# Patient Record
Sex: Female | Born: 1960 | Race: White | Hispanic: No | Marital: Married | Smoking: Never smoker
Health system: Southern US, Community
[De-identification: ages and names within clinical notes are randomized; demographics above are authoritative.]

## PROBLEM LIST (undated history)

## (undated) DIAGNOSIS — M810 Age-related osteoporosis without current pathological fracture: Secondary | ICD-10-CM

## (undated) DIAGNOSIS — I10 Essential (primary) hypertension: Secondary | ICD-10-CM

## (undated) DIAGNOSIS — E785 Hyperlipidemia, unspecified: Secondary | ICD-10-CM

## (undated) HISTORY — PX: ENDOMETRIAL ABLATION: SHX621

## (undated) HISTORY — PX: LIVER RESECTION: SHX1977

---

## 2001-10-30 ENCOUNTER — Encounter: Admission: RE | Admit: 2001-10-30 | Discharge: 2001-10-30 | Payer: Self-pay | Admitting: Emergency Medicine

## 2001-10-30 ENCOUNTER — Encounter: Payer: Self-pay | Admitting: Emergency Medicine

## 2002-01-01 ENCOUNTER — Encounter: Admission: RE | Admit: 2002-01-01 | Discharge: 2002-01-01 | Payer: Self-pay | Admitting: Emergency Medicine

## 2002-01-01 ENCOUNTER — Encounter: Payer: Self-pay | Admitting: Emergency Medicine

## 2002-03-05 ENCOUNTER — Encounter: Payer: Self-pay | Admitting: Emergency Medicine

## 2002-03-05 ENCOUNTER — Encounter: Admission: RE | Admit: 2002-03-05 | Discharge: 2002-03-05 | Payer: Self-pay | Admitting: Emergency Medicine

## 2002-07-15 ENCOUNTER — Other Ambulatory Visit: Admission: RE | Admit: 2002-07-15 | Discharge: 2002-07-15 | Payer: Self-pay | Admitting: Obstetrics & Gynecology

## 2002-09-16 ENCOUNTER — Encounter: Admission: RE | Admit: 2002-09-16 | Discharge: 2002-09-16 | Payer: Self-pay | Admitting: Emergency Medicine

## 2002-09-16 ENCOUNTER — Encounter: Payer: Self-pay | Admitting: Emergency Medicine

## 2003-09-27 ENCOUNTER — Other Ambulatory Visit: Admission: RE | Admit: 2003-09-27 | Discharge: 2003-09-27 | Payer: Self-pay | Admitting: Obstetrics & Gynecology

## 2004-01-10 ENCOUNTER — Encounter: Admission: RE | Admit: 2004-01-10 | Discharge: 2004-01-10 | Payer: Self-pay | Admitting: Emergency Medicine

## 2004-02-01 ENCOUNTER — Encounter: Admission: RE | Admit: 2004-02-01 | Discharge: 2004-02-01 | Payer: Self-pay | Admitting: Emergency Medicine

## 2004-06-28 ENCOUNTER — Encounter: Admission: RE | Admit: 2004-06-28 | Discharge: 2004-06-28 | Payer: Self-pay | Admitting: Emergency Medicine

## 2005-01-10 ENCOUNTER — Other Ambulatory Visit: Admission: RE | Admit: 2005-01-10 | Discharge: 2005-01-10 | Payer: Self-pay | Admitting: Obstetrics & Gynecology

## 2005-03-19 ENCOUNTER — Ambulatory Visit: Payer: Self-pay | Admitting: Internal Medicine

## 2005-04-13 ENCOUNTER — Ambulatory Visit: Payer: Self-pay | Admitting: Internal Medicine

## 2005-04-27 ENCOUNTER — Encounter (INDEPENDENT_AMBULATORY_CARE_PROVIDER_SITE_OTHER): Payer: Self-pay | Admitting: Specialist

## 2005-04-27 ENCOUNTER — Ambulatory Visit: Payer: Self-pay | Admitting: Internal Medicine

## 2005-06-07 ENCOUNTER — Ambulatory Visit: Payer: Self-pay | Admitting: Internal Medicine

## 2005-08-26 ENCOUNTER — Encounter: Admission: RE | Admit: 2005-08-26 | Discharge: 2005-08-26 | Payer: Self-pay | Admitting: Emergency Medicine

## 2005-08-28 ENCOUNTER — Encounter: Admission: RE | Admit: 2005-08-28 | Discharge: 2005-08-28 | Payer: Self-pay | Admitting: Emergency Medicine

## 2006-02-04 ENCOUNTER — Encounter: Admission: RE | Admit: 2006-02-04 | Discharge: 2006-02-04 | Payer: Self-pay | Admitting: Emergency Medicine

## 2006-07-29 ENCOUNTER — Encounter (INDEPENDENT_AMBULATORY_CARE_PROVIDER_SITE_OTHER): Payer: Self-pay | Admitting: Specialist

## 2006-07-29 ENCOUNTER — Ambulatory Visit (HOSPITAL_COMMUNITY): Admission: RE | Admit: 2006-07-29 | Discharge: 2006-07-29 | Payer: Self-pay | Admitting: Obstetrics & Gynecology

## 2006-10-09 ENCOUNTER — Encounter: Admission: RE | Admit: 2006-10-09 | Discharge: 2006-10-09 | Payer: Self-pay | Admitting: Emergency Medicine

## 2009-03-26 ENCOUNTER — Ambulatory Visit: Payer: Self-pay | Admitting: Internal Medicine

## 2009-05-16 ENCOUNTER — Ambulatory Visit: Payer: Self-pay | Admitting: Internal Medicine

## 2009-12-27 ENCOUNTER — Encounter: Admission: RE | Admit: 2009-12-27 | Discharge: 2009-12-27 | Payer: Self-pay | Admitting: Obstetrics & Gynecology

## 2010-01-21 ENCOUNTER — Ambulatory Visit: Payer: Self-pay | Admitting: Family Medicine

## 2010-09-16 ENCOUNTER — Ambulatory Visit: Payer: Self-pay | Admitting: Internal Medicine

## 2010-10-07 ENCOUNTER — Encounter: Payer: Self-pay | Admitting: Emergency Medicine

## 2011-02-02 NOTE — Op Note (Signed)
NAME:  Alicia Love, Alicia Love NO.:  [id number]   MEDICAL RECORD NO.:  [id number]          PATIENT TYPE:  AMB   LOCATION:  SDC                           FACILITY:  [facility]   PHYSICIAN:  Freddy Finner, M.D.   DATE OF BIRTH:  03/02/1961   DATE OF PROCEDURE:  07/29/2006  DATE OF DISCHARGE:                                 OPERATIVE REPORT   PREOPERATIVE DIAGNOSES:  1. Chronic pelvic pain.  2. Known previous pelvic endometriosis.  3. Menorrhagia.  4. Severe dysmenorrhea.   POSTOPERATIVE DIAGNOSES:  1. Minimal recurrent or persistent endometriosis.  2. Uterine enlargement.  3. Endometrial polyps.   PROCEDURE:  Laparoscopy with fulguration of all visible evidence of active  endometriosis plus uterosacral nerve ablation followed by hysteroscopy D&C  with resection of endometrial polyps followed by NovaSure endometrial  ablation.   COMPLICATIONS:  None.   ESTIMATED BLOOD LOSS:  20 mL.   ANESTHESIA:  General.   The patient is a 50 year old with known previous pelvic endometriosis who  has a long history of recurrent symptoms with pelvic pain, dysmenorrhea and  menorrhagia. She is now admitted for surgery. She was admitted on the  morning of surgery, she was given a bolus of Ancef IV preoperatively. She  was brought to the operating room, there placed under adequate general  anesthesia, placed in dorsal lithotomy position using the [place] stirrup  system. Betadine prep of abdomen, perineum and vagina was carried out in the  usual fashion, bladder was evacuated using sterile technique. The cervix was  visualized with a bivalve speculum and a Hulka tenaculum attached without  difficulty. Sterile drapes were applied. The laparoscopic procedure was  performed first. Two small incisions were made, one at the umbilicus and one  just above the symphysis. Through the upper incision, an 11 mm non bladed  disposable trocar was introduced while elevating the anterior abdominal wall  manually. Direct inspection revealed no evidence of injury on entry,  pneumoperitoneum was allowed to accumulate using carbon dioxide gas. A  second 5 mm trocar was used through a small incision just above the  symphysis and it was placed under direct visualization. A blunt probe was  used through the trocar sleeve for traction and exposure during the  procedure. Systematic examination of the pelvic and abdominal contents was  carried out and photographs were made. The uterus was enlarged in overall  size and body to palpation with a probe. There were no definite nodules in  the uterus that were visible. The tube and ovaries on each side were normal.  There were some scattered lesions along the inferior margin of the  peritoneum overlying the cervix and along the right uterosacral ligament and  a small area on the left uterosacral ligament. All of these were fulgurated.  The uterosacral ligaments were fulgurated at their junction with the cervix.  There was no intraabdominal bleeding. This portion of the procedure was  terminated, gas was allowed to escape from the abdomen. The skin incisions  were closed with interrupted subcuticular sutures of 3-0 Dexon, 0.25%  Marcaine was injected into the incision site for postoperative analgesia.  Steri-Strips were  also applied to the lower incision. Attention was then  turned vaginally, the Hulka tenaculum was removed, cervix was grasped on the  anterior lip with a single tooth tenaculum. The cervix sounded to 3 cm,  uterus sounded to 10 cm, cavity width used was 6.5 cm for the NovaSure  procedure. The cervix was progressively dilated to 23 with Pratt's.  Inspection of the cavity revealed 2 small polyps. Gentle thorough curettage  and exploration with Randall stone forceps was carried out, reinspection  confirmed resection of the polyps. The NovaSure device was then applied  without difficulty. A total of 143 watts of power was used for 50 seconds  to  ablate the endometrium, the cavity width was 4 cm, reinspection revealed  adequate complete endometrial ablation. At this point, the procedure was  terminated, the instruments removed. A small puncture wound on the cervix  was injected with 1% Xylocaine for hemostasis. This produced adequate  hemostasis. The patient was given 30 mg of Toradol IV, 30 mg IM before  awakening her and taking her to the recovery room. She was taken there in  good condition. She will be given routine outpatient surgical instructions.  She is followup in the office in 2 weeks. She has Vicodin to be taken as  needed for postoperative pain.      Freddy Finner, M.D.  Electronically Signed     WRN/MEDQ  D:  07/29/2006  T:  07/29/2006  Job:  [id number]

## 2011-02-04 ENCOUNTER — Ambulatory Visit: Payer: Self-pay | Admitting: Internal Medicine

## 2011-09-21 ENCOUNTER — Ambulatory Visit: Payer: Self-pay

## 2011-11-13 ENCOUNTER — Ambulatory Visit: Payer: Self-pay | Admitting: Internal Medicine

## 2012-02-04 ENCOUNTER — Encounter: Payer: Self-pay | Admitting: Internal Medicine

## 2012-12-25 ENCOUNTER — Encounter: Payer: Self-pay | Admitting: Internal Medicine

## 2013-01-15 ENCOUNTER — Ambulatory Visit: Payer: Self-pay | Admitting: Internal Medicine

## 2017-03-09 ENCOUNTER — Ambulatory Visit
Admission: EM | Admit: 2017-03-09 | Discharge: 2017-03-09 | Disposition: A | Discharge status: Home / Self Care | Payer: BLUE CROSS/BLUE SHIELD

## 2017-03-09 ENCOUNTER — Ambulatory Visit (INDEPENDENT_AMBULATORY_CARE_PROVIDER_SITE_OTHER): Payer: BLUE CROSS/BLUE SHIELD

## 2017-03-09 ENCOUNTER — Encounter: Payer: Self-pay | Admitting: *Deleted

## 2017-03-09 DIAGNOSIS — S9032XA Contusion of left foot, initial encounter: Secondary | ICD-10-CM | POA: Diagnosis not present

## 2017-03-09 DIAGNOSIS — S92502A Displaced unspecified fracture of left lesser toe(s), initial encounter for closed fracture: Secondary | ICD-10-CM

## 2017-03-09 HISTORY — DX: Hyperlipidemia, unspecified: E78.5

## 2017-03-09 HISTORY — DX: Essential (primary) hypertension: I10

## 2017-03-09 HISTORY — DX: Age-related osteoporosis without current pathological fracture: M81.0

## 2017-03-09 MED ORDER — TRAMADOL HCL 50 MG PO TABS: 50.0000 mg | ORAL_TABLET | Freq: Three times a day (TID) | ORAL | 0 refills | Status: AC | PRN

## 2017-03-09 NOTE — ED Provider Notes (Signed)
[facility] ____________________________________________  Time seen: Approximately 9:57 AM  I have reviewed the triage vital signs and the nursing notes.   HISTORY  Chief Complaint Foot Pain  HPI Alicia Love is a 56 y.o. female presents for evaluation of left foot pain from an injury that occurred last night at home. Patient reports that approximately 10 PM last night she was at home, trying to step over a dog gate. Reports that she accidentally hit her left second toe on the dog ate causing pain. Reports that she has had pain, and a moderate level that is a throbbing sensation since. States she has had swelling and bruising onset. Reports did take Tylenol last night with slight improvement, no resolution. Reports also has applied ice. No other medications or alleviating factors taken. Denies previous injury to the same area. Reports did have surgery to her left great toe from recurrent bunions. Denies fall to the ground, head injury, loss consciousness or other injuries. Denies pain radiation or paresthesias. Reports otherwise feels well and denies other injury. Denies recent sickness. Denies recent antibiotic use.   PCP: In [place]   Past Medical History:  Diagnosis Date  . Hyperlipemia   . Hypertension   . Osteoporosis     There are no active problems to display for this patient.   Past Surgical History:  Procedure Laterality Date  . ENDOMETRIAL ABLATION    . LIVER RESECTION       No current facility-administered medications for this encounter.   Current Outpatient Prescriptions:  .  escitalopram (LEXAPRO) 20 MG tablet, Take 20 mg by mouth daily., Disp: , Rfl:  .  Est Estrogens-Methyltest (METHYLTEST-EST ESTROGENS PO), Take by mouth., Disp: , Rfl:  .  Progesterone Micronized (PROGESTERONE PO), Take 1 tablet by mouth daily., Disp: , Rfl:  .  traMADol (ULTRAM) 50 MG tablet, Take 1 tablet (50 mg total) by mouth every 8 (eight) hours as needed (Do  not drive or operate machinery while taking as can cause drowsiness.)., Disp: 12 tablet, Rfl: 0  Allergies Patient has no known allergies.  Family History  Problem Relation Age of Onset  . Hypertension Mother   . Hyperlipidemia Mother     Social History Social History  Substance Use Topics  . Smoking status: Never Smoker  . Smokeless tobacco: Never Used  . Alcohol use No    Review of Systems Constitutional: No fever/chills Cardiovascular: Denies chest pain. Respiratory: Denies shortness of breath. Gastrointestinal: No abdominal pain.   Musculoskeletal: Negative for back pain. As above   ____________________________________________   PHYSICAL EXAM:  VITAL SIGNS: ED Triage Vitals  Enc Vitals Group     BP 03/09/17 0932 138/85     Pulse Rate 03/09/17 0932 70     Resp 03/09/17 0932 16     Temp 03/09/17 0932 98.6 F (37 C)     Temp Source 03/09/17 0932 Oral     SpO2 03/09/17 0932 99 %     Weight 03/09/17 0933 182 lb (82.6 kg)     Height 03/09/17 0933 5\' 2"  (1.575 m)     Head Circumference --      Peak Flow --      Pain Score 03/09/17 0933 2     Pain Loc --      Pain Edu? --      Excl. in GC? --     Constitutional: Alert and oriented. Well appearing and in no acute distress. Cardiovascular: Normal rate, regular rhythm. Grossly normal heart sounds.  Good peripheral circulation. Respiratory: Normal respiratory effort without tachypnea nor retractions. Breath sounds are clear and equal bilaterally. No wheezes, rales, rhonchi. Musculoskeletal: No midline cervical, thoracic or lumbar tenderness to palpation. Bilateral pedal pulses equal and easily palpated. Except: Left foot proximal phalanx of second toe and second MTP joint moderate tenderness to palpation, mild swelling and mild to moderate ecchymosis, pain with flexion and extension, normal distal sensation and capillary refill. Left foot and left lower extremity otherwise nontender. Neurologic:  Normal speech and  language.Speech is normal. No gait instability.  Skin:  Skin is warm, dry. Psychiatric: Mood and affect are normal. Speech and behavior are normal. Patient exhibits appropriate insight and judgment   ___________________________________________   LABS (all labs ordered are listed, but only abnormal results are displayed)  Labs Reviewed - No data to display ____________________________________________  RADIOLOGY  Dg Toe 2nd Left  Result Date: 03/09/2017 CLINICAL DATA:  PT with left 2nd, toe pain after fall yesterday. No hx of previous injury or trauma. EXAM: LEFT SECOND TOE COMPARISON:  None. FINDINGS: Subtle cortical regularity along the articular surface of the proximal metaphysis, proximal phalanx, second digit. Small loose bodies in the joint at this level appear chronic. No dislocation. IMPRESSION: Questionable nondisplaced fracture the base of the proximal phalanx second digit. Recommend clinical correlation for point tenderness at the second metatarsal phalangeal joint. Electronically Signed   By: Genevive BiStewart  Edmunds M.D.   On: 03/09/2017 10:27   ____________________________________________   PROCEDURES Procedures    INITIAL IMPRESSION / ASSESSMENT AND PLAN / ED COURSE  Pertinent labs & imaging results that were available during my care of the patient were reviewed by me and considered in my medical decision making (see chart for details).  Well-appearing patient. No acute distress. Presents for left second toe pain after mechanical injury last night. Will evaluate by x-ray.  X-ray reviewed by myself as well as radiologist. Suspect nondisplaced fracture of the base of the proximal phalanx of second digit. Will buddy tape second and third toes, postoperative shoe applied. Encouraged rest, ice, elevation. Over-the-counter Tylenol or improvement as needed and tramadol as needed for breakthrough pain. Follow-up with podiatry or primary care physician in 1-2 weeks.Discussed indication,  risks and benefits of medications with patient.   [place] controlled substance database reviewed, and no recent controlled medications documented.  Discussed follow up with Primary care physician this week. Discussed follow up and return parameters including no resolution or any worsening concerns. Patient verbalized understanding and agreed to plan.   ____________________________________________   FINAL CLINICAL IMPRESSION(S) / ED DIAGNOSES  Final diagnoses:  Closed fracture of phalanx of left second toe, initial encounter  Contusion of left foot, initial encounter     New Prescriptions   TRAMADOL (ULTRAM) 50 MG TABLET    Take 1 tablet (50 mg total) by mouth every 8 (eight) hours as needed (Do not drive or operate machinery while taking as can cause drowsiness.).    Note: This dictation was prepared with Dragon dictation along with smaller phrase technology. Any transcriptional errors that result from this process are unintentional.         Renford DillsMiller, Dung Salinger, NP 03/09/17 1045

## 2017-03-09 NOTE — Discharge Instructions (Signed)
Take medication as prescribed. Rest. Elevate and apply ice. Drink plenty of fluids.   Follow up with your primary care physician or the above in one week. Return to Urgent care for new or worsening concerns.

## 2017-11-28 IMAGING — CR DG TOE 2ND 2+V*L*
3 series · 3 of 3 positions shown · non-contrast
Comparison: None.

CLINICAL DATA: PT with left 2nd, toe pain after fall yesterday. No
hx of previous injury or trauma.

EXAM:
LEFT SECOND TOE

[toe ap]
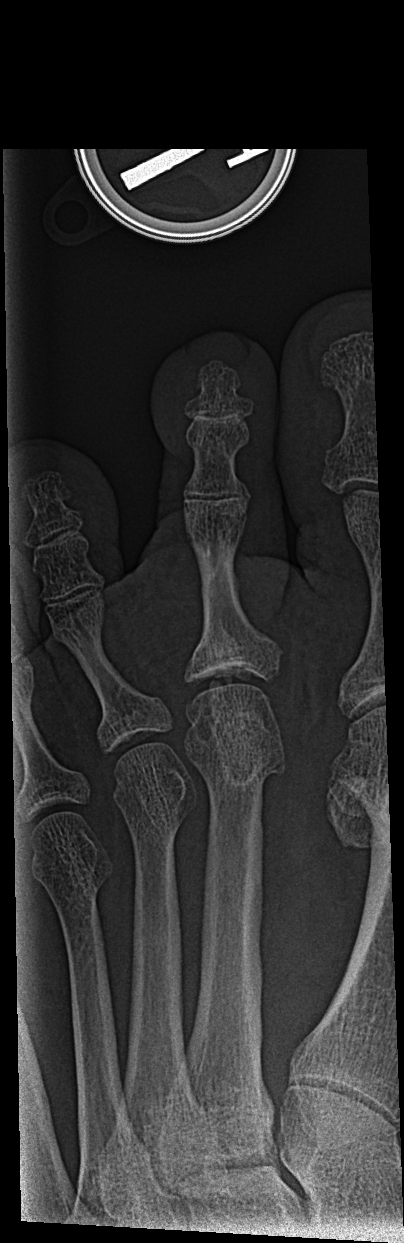

[toe obl]
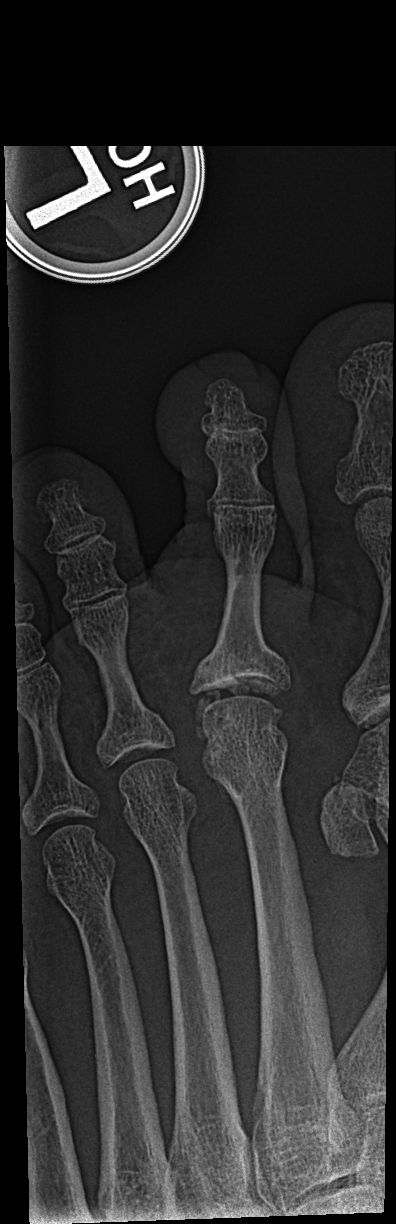

[toe lat]
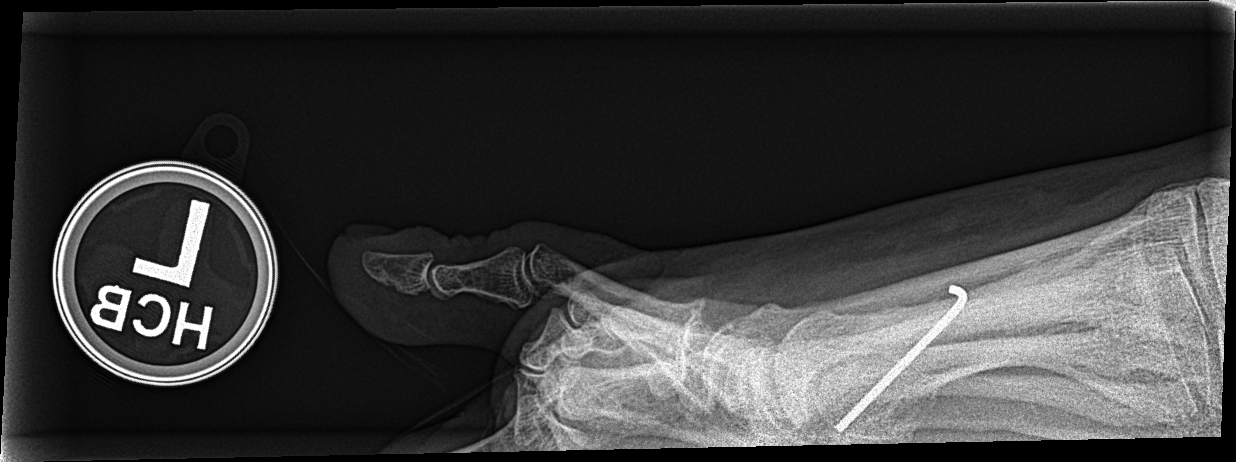

[3 of 3 positions shown; findings below may reference images not displayed]

FINDINGS: Subtle cortical regularity along the articular surface of the
proximal metaphysis, proximal phalanx, second digit. Small loose
bodies in the joint at this level appear chronic. No dislocation.
IMPRESSION: Questionable nondisplaced fracture the base of the proximal phalanx
second digit. Recommend clinical correlation for point tenderness at
the second metatarsal phalangeal joint.

## 2020-09-13 ENCOUNTER — Ambulatory Visit: Payer: Self-pay

## 2021-04-20 NOTE — Progress Notes (Signed)
[facility] - Clinic Note  04/24/2021     CHIEF COMPLAINT Patient presents for Retina Evaluation   HISTORY OF PRESENT ILLNESS: Alicia Love is a 60 y.o. female who presents to the clinic today for:   HPI     Retina Evaluation   In left eye.  Associated Symptoms Flashes and Floaters.  Negative for Distortion, Blind Spot, Pain, Redness, Photophobia, Glare, Trauma, Scalp Tenderness, Jaw Claudication, Shoulder/Hip pain, Fever, Weight Loss and Fatigue.  Context:  distance vision.  Treatments tried include no treatments.  I, the attending physician,  performed the HPI with the patient and updated documentation appropriately.        Comments   Pt is here on the referral of Dr. Lucina Mellow for concern of PVD OS, pt states she went to see Dr. Lucina Mellow bc she started to see dark fol temporally extending nasally, as well as a bunch of floaters, she states the floaters have now accumulated in her central vision, pt states Dr. Lucina Mellow told her the vitreous jelly in her eyes was "thickening" due to age, pt denies being diabetic, but is hypertensive      Last edited by Rennis Chris, MD on 04/24/2021  2:14 PM.    Pt is here on the referral of Dr. Lucina Mellow for concern of PVD OS, pt states she has been seeing Dr. Lucina Mellow for 36 years, she states a couple weeks ago she started to see dark fol and floaters in her left eye only, pt states these floaters now look like a "screen" in her vision, she states she sees light fol in the dark as well, pt denies being diabetic, but does take medication for HTN  Referring physician: Earnstine Regal, OD [address] [place],  [place] [address]  HISTORICAL INFORMATION:   Selected notes from the MEDICAL RECORD NUMBER Referred by Dr. Lucina Mellow for PVD OD LEE:  Ocular Hx- PMH-    CURRENT MEDICATIONS: No current outpatient medications on file. (Ophthalmic Drugs)   No current facility-administered medications for this visit. (Ophthalmic Drugs)   Current  Outpatient Medications (Other)  Medication Sig   Cholecalciferol 50 MCG (2000 UT) TABS Take 1 tablet by mouth daily.   estradiol (ESTRACE) 2 MG tablet Take by mouth.   hydrochlorothiazide (HYDRODIURIL) 12.5 MG tablet Take by mouth.   progesterone (PROMETRIUM) 100 MG capsule Take by mouth.   amphetamine-dextroamphetamine (ADDERALL XR) 30 MG 24 hr capsule Take 30 mg by mouth daily.   escitalopram (LEXAPRO) 20 MG tablet Take 20 mg by mouth daily.   Est Estrogens-Methyltest (METHYLTEST-EST ESTROGENS PO) Take by mouth.   Progesterone Micronized (PROGESTERONE PO) Take 1 tablet by mouth daily.   rosuvastatin (CRESTOR) 10 MG tablet Take 10 mg by mouth daily.   traMADol (ULTRAM) 50 MG tablet Take 1 tablet (50 mg total) by mouth every 8 (eight) hours as needed (Do not drive or operate machinery while taking as can cause drowsiness.).   No current facility-administered medications for this visit. (Other)    REVIEW OF SYSTEMS: ROS   Positive for: Cardiovascular, Eyes Negative for: Constitutional, Gastrointestinal, Neurological, Skin, Genitourinary, Musculoskeletal, HENT, Endocrine, Respiratory, Psychiatric, Allergic/Imm, Heme/Lymph Last edited by Posey Boyer, COT on 04/24/2021  1:26 PM.       ALLERGIES No Known Allergies  PAST MEDICAL HISTORY Past Medical History:  Diagnosis Date   Hyperlipemia    Hypertension    Osteoporosis    Past Surgical History:  Procedure Laterality Date   ENDOMETRIAL ABLATION  LIVER RESECTION      FAMILY HISTORY Family History  Problem Relation Age of Onset   Macular degeneration Mother    Hypertension Mother    Hyperlipidemia Mother    Macular degeneration Maternal Aunt     SOCIAL HISTORY Social History   Tobacco Use   Smoking status: Never   Smokeless tobacco: Never  Substance Use Topics   Alcohol use: No   Drug use: No         OPHTHALMIC EXAM:  Base Eye Exam     Visual Acuity (Snellen - Linear)       Right Left   Dist cc  20/20 -2 20/20 -2   Dist ph cc 20/20 -1 20/20 -1    Correction: Glasses         Tonometry (Tonopen, 1:33 PM)       Right Left   Pressure 10 12         Pupils       Dark Light Shape React APD   Right 3 2 Round Brisk None   Left 3 2 Round Brisk None         Visual Fields (Counting fingers)       Left Right    Full Full         Extraocular Movement       Right Left    Full, Ortho Full, Ortho         Neuro/Psych     Oriented x3: Yes   Mood/Affect: Normal           Slit Lamp and Fundus Exam     Slit Lamp Exam       Right Left   Lids/Lashes Dermatochalasis - upper lid Dermatochalasis - upper lid   Conjunctiva/Sclera White and quiet White and quiet   Cornea arcus arcus   Anterior Chamber Deep and quiet Deep and quiet   Iris Round and dilated Round and dilated   Lens 2+ Nuclear sclerosis, 2-3+ Cortical cataract 2+ Nuclear sclerosis, 2-3+ Cortical cataract   Vitreous Vitreous syneresis Vitreous syneresis, Posterior vitreous detachment, vitreous condensations         Fundus Exam       Right Left   Disc Compact, mild tilt, temporal PPA Compact, mild tilt, Pink and Sharp, temporal PPP   C/D Ratio 0.3 0.3   Macula Flat, Blunted foveal reflex, RPE mottling, No heme or edema Flat, Blunted foveal reflex, RPE mottling, No heme or edema   Vessels mild attenuation, mild Copper wiring, mild tortuousity attenuated, Tortuous, mild Copper wiring   Periphery Attached    Attached              Refraction     Wearing Rx       Sphere Cylinder Axis   Right -5.00 Sphere    Left -7.00 +1.25 074            IMAGING AND PROCEDURES  Imaging and Procedures for 04/24/2021  OCT, Retina - OU - Both Eyes       Right Eye Quality was good. Central Foveal Thickness: 255. Progression has no prior data. Findings include normal foveal contour, no IRF, no SRF, myopic contour.   Left Eye Quality was good. Central Foveal Thickness: 260. Progression has no prior  data. Findings include normal foveal contour, no IRF, no SRF, myopic contour (Trace vitreous opacities).   Notes *Images captured and stored on drive  Diagnosis / Impression:  NFP, no IRF/SRF OU OS: trace vitreous opacities  Clinical management:  See below  Abbreviations: NFP - Normal foveal profile. CME - cystoid macular edema. PED - pigment epithelial detachment. IRF - intraretinal fluid. SRF - subretinal fluid. EZ - ellipsoid zone. ERM - epiretinal membrane. ORA - outer retinal atrophy. ORT - outer retinal tubulation. SRHM - subretinal hyper-reflective material. IRHM - intraretinal hyper-reflective material              ASSESSMENT/PLAN:    ICD-10-CM   1. Posterior vitreous detachment of left eye  H43.812     2. Retinal edema  H35.81 OCT, Retina - OU - Both Eyes    3. Essential hypertension  I10     4. Hypertensive retinopathy of both eyes  H35.033     5. Combined forms of age-related cataract of both eyes  H25.813      1,2. PVD / vitreous syneresis OS  - symptomatic flashes of light and floaters started 2 weeks ago per pt  - presented to Dr. Lucina Mellow on 7.28.22  - Discussed findings and prognosis  - No RT or RD on 360 scleral depressed exam  - Reviewed s/s of RT/RD  - Strict return precautions for any such RT/RD signs/symptoms  - f/u in 4 wks -- DFE/OCT  3,4. Hypertensive retinopathy OU - discussed importance of tight BP control - monitor  5. Mixed Cataract OU  - The symptoms of cataract, surgical options, and treatments and risks were discussed with patient. - discussed diagnosis and progression - monitor  Ophthalmic Meds Ordered this visit:  No orders of the defined types were placed in this encounter.      Return in about 4 weeks (around 05/22/2021) for f/u PVD OS, DFE, OCT.  There are no Patient Instructions on file for this visit.   Explained the diagnoses, plan, and follow up with the patient and they expressed understanding.  Patient expressed  understanding of the importance of proper follow up care.   Karie Chimera, M.D., Ph.D. Diseases & Surgery of the Retina and Vitreous [facility]  I have reviewed the above documentation for accuracy and completeness, and I agree with the above. Karie Chimera, M.D., Ph.D. 04/24/21 2:17 PM   Abbreviations: M myopia (nearsighted); A astigmatism; H hyperopia (farsighted); P presbyopia; Mrx spectacle prescription;  CTL contact lenses; OD right eye; OS left eye; OU both eyes  XT exotropia; ET esotropia; PEK punctate epithelial keratitis; PEE punctate epithelial erosions; DES dry eye syndrome; MGD meibomian gland dysfunction; ATs artificial tears; PFAT's preservative free artificial tears; NSC nuclear sclerotic cataract; PSC posterior subcapsular cataract; ERM epi-retinal membrane; PVD posterior vitreous detachment; RD retinal detachment; DM diabetes mellitus; DR diabetic retinopathy; NPDR non-proliferative diabetic retinopathy; PDR proliferative diabetic retinopathy; CSME clinically significant macular edema; DME diabetic macular edema; dbh dot blot hemorrhages; CWS cotton wool spot; POAG primary open angle glaucoma; C/D cup-to-disc ratio; HVF humphrey visual field; GVF goldmann visual field; OCT optical coherence tomography; IOP intraocular pressure; BRVO Branch retinal vein occlusion; CRVO central retinal vein occlusion; CRAO central retinal artery occlusion; BRAO branch retinal artery occlusion; RT retinal tear; SB scleral buckle; PPV pars plana vitrectomy; VH Vitreous hemorrhage; PRP panretinal laser photocoagulation; IVK intravitreal kenalog; VMT vitreomacular traction; MH Macular hole;  NVD neovascularization of the disc; NVE neovascularization elsewhere; AREDS age related eye disease study; ARMD age related macular degeneration; POAG primary open angle glaucoma; EBMD epithelial/anterior basement membrane dystrophy; ACIOL anterior chamber intraocular lens; IOL intraocular lens;  PCIOL posterior chamber intraocular lens; Phaco/IOL phacoemulsification with intraocular  lens placement; [place] photorefractive keratectomy; LASIK laser assisted in situ keratomileusis; HTN hypertension; DM diabetes mellitus; COPD chronic obstructive pulmonary disease

## 2021-04-24 ENCOUNTER — Other Ambulatory Visit: Payer: Self-pay

## 2021-04-24 ENCOUNTER — Ambulatory Visit (INDEPENDENT_AMBULATORY_CARE_PROVIDER_SITE_OTHER): Payer: BC Managed Care – PPO | Admitting: Ophthalmology

## 2021-04-24 ENCOUNTER — Encounter (INDEPENDENT_AMBULATORY_CARE_PROVIDER_SITE_OTHER): Payer: Self-pay | Admitting: Ophthalmology

## 2021-04-24 DIAGNOSIS — H35033 Hypertensive retinopathy, bilateral: Secondary | ICD-10-CM

## 2021-04-24 DIAGNOSIS — H3581 Retinal edema: Secondary | ICD-10-CM | POA: Diagnosis not present

## 2021-04-24 DIAGNOSIS — H43812 Vitreous degeneration, left eye: Secondary | ICD-10-CM | POA: Diagnosis not present

## 2021-04-24 DIAGNOSIS — H25813 Combined forms of age-related cataract, bilateral: Secondary | ICD-10-CM

## 2021-04-24 DIAGNOSIS — I10 Essential (primary) hypertension: Secondary | ICD-10-CM | POA: Diagnosis not present

## 2021-05-23 ENCOUNTER — Encounter (INDEPENDENT_AMBULATORY_CARE_PROVIDER_SITE_OTHER): Payer: BC Managed Care – PPO | Admitting: Ophthalmology

## 2021-05-25 ENCOUNTER — Encounter (INDEPENDENT_AMBULATORY_CARE_PROVIDER_SITE_OTHER): Payer: BC Managed Care – PPO | Admitting: Ophthalmology

## 2021-05-25 DIAGNOSIS — I10 Essential (primary) hypertension: Secondary | ICD-10-CM

## 2021-05-25 DIAGNOSIS — H25813 Combined forms of age-related cataract, bilateral: Secondary | ICD-10-CM

## 2021-05-25 DIAGNOSIS — H35033 Hypertensive retinopathy, bilateral: Secondary | ICD-10-CM

## 2021-05-25 DIAGNOSIS — H3581 Retinal edema: Secondary | ICD-10-CM

## 2021-05-25 DIAGNOSIS — H43812 Vitreous degeneration, left eye: Secondary | ICD-10-CM
